# Patient Record
Sex: Male | Born: 1980 | Race: Black or African American | Hispanic: No | Marital: Married | Smoking: Current every day smoker
Health system: Southern US, Community
[De-identification: ages and names within clinical notes are randomized; demographics above are authoritative.]

## PROBLEM LIST (undated history)

## (undated) DIAGNOSIS — I10 Essential (primary) hypertension: Secondary | ICD-10-CM

---

## 2009-09-03 ENCOUNTER — Emergency Department: Payer: Self-pay | Admitting: Emergency Medicine

## 2009-10-31 ENCOUNTER — Encounter: Payer: Self-pay | Admitting: Physician Assistant

## 2009-11-09 ENCOUNTER — Encounter: Payer: Self-pay | Admitting: Physician Assistant

## 2014-08-16 ENCOUNTER — Emergency Department (INDEPENDENT_AMBULATORY_CARE_PROVIDER_SITE_OTHER): Payer: Self-pay

## 2014-08-16 ENCOUNTER — Encounter (HOSPITAL_COMMUNITY): Payer: Self-pay | Admitting: Emergency Medicine

## 2014-08-16 ENCOUNTER — Emergency Department (INDEPENDENT_AMBULATORY_CARE_PROVIDER_SITE_OTHER)
Admission: EM | Admit: 2014-08-16 | Discharge: 2014-08-16 | Disposition: A | Discharge status: Home / Self Care | Payer: Self-pay | Source: Home / Self Care | Attending: Emergency Medicine | Admitting: Emergency Medicine

## 2014-08-16 DIAGNOSIS — J209 Acute bronchitis, unspecified: Secondary | ICD-10-CM

## 2014-08-16 MED ORDER — ALBUTEROL SULFATE HFA 108 (90 BASE) MCG/ACT IN AERS
INHALATION_SPRAY | RESPIRATORY_TRACT | Status: AC
  Filled 2014-08-16: qty 6.7

## 2014-08-16 MED ORDER — ALBUTEROL SULFATE HFA 108 (90 BASE) MCG/ACT IN AERS
1.0000 | INHALATION_SPRAY | RESPIRATORY_TRACT | Status: DC | PRN
  Administered 2014-08-16: 1 via RESPIRATORY_TRACT

## 2014-08-16 MED ORDER — AZITHROMYCIN 250 MG PO TABS
ORAL_TABLET | ORAL | Status: DC
Start: 2014-08-16 — End: 2018-04-01

## 2014-08-16 MED ORDER — PREDNISONE 10 MG PO TABS: ORAL_TABLET | ORAL | Status: DC

## 2014-08-16 NOTE — Discharge Instructions (Signed)

## 2014-08-16 NOTE — ED Provider Notes (Signed)
CSN: [id number]     Arrival date & time 08/16/14  1642 History   First MD Initiated Contact with Patient 08/16/14 1735     Chief Complaint  Patient presents with  . Allergies   (Consider location/radiation/quality/duration/timing/severity/associated sxs/prior Treatment) HPI       34 year old male presents complaining of cough, runny nose, mild shortness of breath when laying down flat, nasal congestion, upper back pain. This started about 6 days ago. He felt very sick for the first day but this is gradually getting better since then. However yesterday his symptoms started getting much worse. He feels more short of breath and feels generalized fatigue. No shortness of breath on exertion. No leg swelling. No history of any heart problems or any other medical illnesses. No recent travel or sick contacts.  History reviewed. No pertinent past medical history. History reviewed. No pertinent past surgical history. No family history on file. History  Substance Use Topics  . Smoking status: Current Every Day Smoker -- 0.50 packs/day    Types: Cigarettes  . Smokeless tobacco: Not on file  . Alcohol Use: No    Review of Systems  Constitutional: Positive for fatigue. Negative for fever and chills.  HENT: Positive for congestion, rhinorrhea and sore throat. Negative for sinus pressure.   Respiratory: Positive for cough and chest tightness. Negative for shortness of breath.   Cardiovascular: Positive for chest pain.  Musculoskeletal: Positive for back pain.  All other systems reviewed and are negative.   Allergies  Review of patient's allergies indicates no known allergies.  Home Medications   Prior to Admission medications   Medication Sig Start Date End Date Taking? Authorizing Provider  azithromycin (ZITHROMAX Z-PAK) 250 MG tablet Use as directed 08/16/14   Graylon GoodZachary H Gentry Pilson, PA-C  predniSONE (DELTASONE) 10 MG tablet 4 tabs PO QD for 4 days; 3 tabs PO QD for 3 days; 2 tabs PO QD for 2 days; 1  tab PO QD for 1 day 08/16/14   Graylon GoodZachary H Sukhraj Esquivias, PA-C   BP 151/98 mmHg  Pulse 77  Temp(Src) 97.4 F (36.3 C) (Oral)  Resp 18  SpO2 96% Physical Exam  Constitutional: He is oriented to person, place, and time. He appears well-developed and well-nourished. No distress.  HENT:  Head: Normocephalic and atraumatic.  Right Ear: Tympanic membrane, external ear and ear canal normal.  Left Ear: Tympanic membrane, external ear and ear canal normal.  Nose: Nose normal. Right sinus exhibits no maxillary sinus tenderness and no frontal sinus tenderness. Left sinus exhibits no maxillary sinus tenderness and no frontal sinus tenderness.  Mouth/Throat: Uvula is midline, oropharynx is clear and moist and mucous membranes are normal. No oropharyngeal exudate or posterior oropharyngeal erythema.  Eyes: Conjunctivae are normal.  Neck: Normal range of motion. Neck supple.  Cardiovascular: Normal rate, regular rhythm and normal heart sounds.   Pulmonary/Chest: Effort normal. No respiratory distress. He has wheezes in the left middle field and the left lower field. He has no rhonchi. He has no rales.  Lymphadenopathy:    He has no cervical adenopathy.  Neurological: He is alert and oriented to person, place, and time. Coordination normal.  Skin: Skin is warm and dry. No rash noted. He is not diaphoretic.  Psychiatric: He has a normal mood and affect. Judgment normal.  Nursing note and vitals reviewed.   ED Course  Procedures (including critical care time) Labs Review Labs Reviewed - No data to display  Imaging Review Dg Chest 2 View  08/16/2014  CLINICAL DATA:  Cough for a week, smoker  EXAM: CHEST  2 VIEW  COMPARISON:  None.  FINDINGS: Cardiomediastinal silhouette is unremarkable. No acute infiltrate or pleural effusion. No pulmonary edema. Bony thorax is unremarkable.  IMPRESSION: No active cardiopulmonary disease.   Electronically Signed   By: Natasha Mead M.D.   On: 08/16/2014 18:13     MDM   1.  Acute bronchitis, unspecified organism    With the double worsening, concern for bacterial infection. Treat with azithromycin as well as symptomatically management. Follow-up when necessary if no improvement in a few days   Meds ordered this encounter  Medications  . predniSONE (DELTASONE) 10 MG tablet    Sig: 4 tabs PO QD for 4 days; 3 tabs PO QD for 3 days; 2 tabs PO QD for 2 days; 1 tab PO QD for 1 day    Dispense:  30 tablet    Refill:  0  . albuterol (PROVENTIL HFA;VENTOLIN HFA) 108 (90 BASE) MCG/ACT inhaler 1-2 puff    Sig:   . azithromycin (ZITHROMAX Z-PAK) 250 MG tablet    Sig: Use as directed    Dispense:  6 each    Refill:  0     Graylon Good, PA-C 08/16/14 1835

## 2014-08-16 NOTE — ED Notes (Signed)
C/o allergy sx onset 5 days Sx include runny nose, SOB, congestion and upper back pain Denies fevers and chills Alert, no signs of acute distress.

## 2018-03-19 ENCOUNTER — Encounter (HOSPITAL_COMMUNITY): Payer: Self-pay | Admitting: Emergency Medicine

## 2018-03-19 ENCOUNTER — Emergency Department (HOSPITAL_COMMUNITY)
Admission: EM | Admit: 2018-03-19 | Discharge: 2018-03-19 | Disposition: A | Discharge status: Home / Self Care | Payer: Self-pay | Attending: Emergency Medicine | Admitting: Emergency Medicine

## 2018-03-19 DIAGNOSIS — F17210 Nicotine dependence, cigarettes, uncomplicated: Secondary | ICD-10-CM | POA: Insufficient documentation

## 2018-03-19 DIAGNOSIS — R1030 Lower abdominal pain, unspecified: Secondary | ICD-10-CM | POA: Insufficient documentation

## 2018-03-19 DIAGNOSIS — R1031 Right lower quadrant pain: Secondary | ICD-10-CM

## 2018-03-19 DIAGNOSIS — I10 Essential (primary) hypertension: Secondary | ICD-10-CM | POA: Insufficient documentation

## 2018-03-19 DIAGNOSIS — K6289 Other specified diseases of anus and rectum: Secondary | ICD-10-CM | POA: Insufficient documentation

## 2018-03-19 HISTORY — DX: Essential (primary) hypertension: I10

## 2018-03-19 MED ORDER — IBUPROFEN 800 MG PO TABS: 800.0000 mg | ORAL_TABLET | Freq: Three times a day (TID) | ORAL | 0 refills | Status: AC | PRN

## 2018-03-19 MED ORDER — METHOCARBAMOL 500 MG PO TABS: 500.0000 mg | ORAL_TABLET | Freq: Three times a day (TID) | ORAL | 0 refills | Status: AC | PRN

## 2018-03-19 NOTE — ED Triage Notes (Signed)
Pt presents with couple days of pain in the posterior central buttock area (crease) and R groin pain; works in [employer] with heavy lifting; pt denies urinary changes, no fever, no n/v/d; pt denies masses or growths to R inguinal area

## 2018-03-19 NOTE — Discharge Instructions (Signed)
We believe that your symptoms are caused by musculoskeletal strain.  Please read through the included information about additional care such as heating pads, over-the-counter pain medicine.  If you were provided a prescription please use it only as needed and as instructed.  Remember that early mobility and using the affected part of your body is actually better than keeping it immobile.  If this groin pain continues your PCP may consider sending you for a CT scan. Call on Monday to schedule a follow up appointment. Do not take the Robaxin if you will be driving or going to work as this can make you sleepy.   Follow-up with the doctor listed as recommended or return to the emergency department with new or worsening symptoms that concern you.

## 2018-03-19 NOTE — ED Notes (Signed)
Patient able to ambulate independently  

## 2018-03-19 NOTE — ED Provider Notes (Signed)
Emergency Department Provider Note   I have reviewed the triage vital signs and the nursing notes.   HISTORY  Chief Complaint Groin Pain and Rectal Pain   HPI Daniel Tanner is a 37 y.o. male with PMH of HTN presents to the emergency department for evaluation of intermittent right groin pain.  The patient states that he will have this pain on and off and it is worse with movement.  He groin masses or testicular/scrotal swelling or pain.  He states he has a job that requires him to do a lot of heavy lifting.  He sometimes has radiation of symptoms into the right upper thigh but not consistently.  No weakness or numbness.  No dysuria, hesitancy, urgency.  No urethral discharge.   Patient is also complaining of some right gluteal pain.  He states it hurts to sit but denies appreciating any masses.  Does not have drainage from this area.  No injury that he can recall.  This is been ongoing for the past week.  Pain is mild and constant.   Past Medical History:  Diagnosis Date  . Hypertension     There are no active problems to display for this patient.   History reviewed. No pertinent surgical history.  Allergies Patient has no known allergies.  History reviewed. No pertinent family history.  Social History Social History   Tobacco Use  . Smoking status: Current Every Day Smoker    Packs/day: 0.50    Types: Cigarettes  Substance Use Topics  . Alcohol use: No  . Drug use: No    Review of Systems  Constitutional: No fever/chills Eyes: No visual changes. ENT: No sore throat. Cardiovascular: Denies chest pain. Respiratory: Denies shortness of breath. Gastrointestinal: No abdominal pain.  No nausea, no vomiting.  No diarrhea.  No constipation. Genitourinary: Negative for dysuria. Musculoskeletal: Negative for back pain. Positive right groin and gluteal swelling.  Skin: Negative for rash. Neurological: Negative for headaches, focal weakness or numbness.  10-point  ROS otherwise negative.  ____________________________________________   PHYSICAL EXAM:  VITAL SIGNS: ED Triage Vitals  Enc Vitals Group     BP 03/19/18 1627 (!) 154/90     Pulse Rate 03/19/18 1627 90     Resp 03/19/18 1627 18     Temp 03/19/18 1627 98.5 F (36.9 C)     Temp Source 03/19/18 1627 Oral     SpO2 03/19/18 1627 100 %     Pain Score 03/19/18 1631 2   Constitutional: Alert and oriented. Well appearing and in no acute distress. Eyes: Conjunctivae are normal.  Head: Atraumatic. Nose: No congestion/rhinnorhea. Mouth/Throat: Mucous membranes are moist. Neck: No stridor.   Cardiovascular: Good peripheral circulation. Respiratory: Normal respiratory effort.  Gastrointestinal:  No distention. No groin tenderness, swelling, or masses. No changes in exam with valsalva. Normal testicular exam without cellulitis or testicle tenderness. No appreciable gluteal masses, abscess, or rash. No focal tenderness. Normal external rectal exam. Area of identified tenderness is near the gluteal cleft midway up the gluteus.  Musculoskeletal: No lower extremity tenderness nor edema. No gross deformities of extremities.  Neurologic:  Normal speech and language. No gross focal neurologic deficits are appreciated.  Skin:  Skin is warm, dry and intact. No rash noted.  ____________________________________________  RADIOLOGY  None ____________________________________________   PROCEDURES  Procedure(s) performed:   Procedures  None  ____________________________________________   INITIAL IMPRESSION / ASSESSMENT AND PLAN / ED COURSE  Pertinent labs & imaging results that were available during  my care of the patient were reviewed by me and considered in my medical decision making (see chart for details).  Groin pain with some associated gluteal discomfort.  I do not appreciate any hernia.  No concern for testicular torsion, infection, or kidney stone.  The patient's exam of the gluteal  region does not demonstrate an obvious abscess.  I have no concern clinically for perirectal abscess as his pain is higher up.  Plan for Motrin as needed and Robaxin for more severe pain in the groin.  This is likely musculoskeletal but I did discuss the possibility of hernia with the patient.  He plans to establish care with a PCP.  I told him that if pain worsened in his groin or he appreciate any sudden masses or swelling he should return to the emergency department.  Otherwise, the patient can follow-up with PCP for outpatient CT if necessary and referral to surgery at that time if the hernia is appreciated.   At this time, I do not feel there is any life-threatening condition present. I have reviewed and discussed all results (EKG, imaging, lab, urine as appropriate), exam findings with patient. I have reviewed nursing notes and appropriate previous records.  I feel the patient is safe to be discharged home without further emergent workup. Discussed usual and customary return precautions. Patient and family (if present) verbalize understanding and are comfortable with this plan.  Patient will follow-up with their primary care provider. If they do not have a primary care provider, information for follow-up has been provided to them. All questions have been answered.  ____________________________________________  FINAL CLINICAL IMPRESSION(S) / ED DIAGNOSES  Final diagnoses:  Right inguinal pain  Rectal pain    NEW OUTPATIENT MEDICATIONS STARTED DURING THIS VISIT:  Discharge Medication List as of 03/19/2018  5:18 PM    START taking these medications   Details  ibuprofen (ADVIL,MOTRIN) 800 MG tablet Take 1 tablet (800 mg total) by mouth every 8 (eight) hours as needed for moderate pain., Starting Fri 03/19/2018, Print    methocarbamol (ROBAXIN) 500 MG tablet Take 1 tablet (500 mg total) by mouth every 8 (eight) hours as needed for muscle spasms., Starting Fri 03/19/2018, Print        Note:   This document was prepared using Dragon voice recognition software and may include unintentional dictation errors.  Alona Bene, MD Emergency Medicine    Kamdyn Covel, Arlyss Repress, MD 03/19/18 1726

## 2018-03-31 NOTE — Progress Notes (Signed)
Patient ID: Daniel Tanner, male   DOB: 07-12-1980, 37 y.o.   MRN: [id number]         Daniel Tanner, is a 37 y.o. male  MVH:[id number]  WUX:[id number]  DOB - 07-12-1980  Subjective:  Chief Complaint and HPI: Daniel Tanner is a 37 y.o. male here today to establish care and for a follow up visit  After being seen in the ED 03/19/2018 for groin and buttocks pain.  Pain is much improved.    I notice his BP is high.  He says "it's always up."  He says at his physical a year or 2 ago it was high.  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  Denies CP/SOB/dizziness/HA   Daniel BushyLouis S Tanner is a 37 y.o. male with PMH of HTN presents to the emergency department for evaluation of intermittent right groin pain.  The patient states that he will have this pain on and off and it is worse with movement.  He groin masses or testicular/scrotal swelling or pain.  He states he has a job that requires him to do a lot of heavy lifting.  He sometimes has radiation of symptoms into the right upper thigh but not consistently.  No weakness or numbness.  No dysuria, hesitancy, urgency.  No urethral discharge.   Patient is also complaining of some right gluteal pain.  He states it hurts to sit but denies appreciating any masses.  Does not have drainage from this area.  No injury that he can recall.  This is been ongoing for the past week.  Pain is mild and constant.  From A/P:  Pertinent labs & imaging results that were available during my care of the patient were reviewed by me and considered in my medical decision making (see chart for details).  Groin pain with some associated gluteal discomfort.  I do not appreciate any hernia.  No concern for testicular torsion, infection, or kidney stone.  The patient's exam of the gluteal region does not demonstrate an obvious abscess.  I have no concern clinically for perirectal abscess as his pain is higher up.  Plan for Motrin as needed and Robaxin for more severe  pain in the groin.  This is likely musculoskeletal but I did discuss the possibility of hernia with the patient.  He plans to establish care with a PCP.  I told him that if pain worsened in his groin or he appreciate any sudden masses or swelling he should return to the emergency department.  Otherwise, the patient can follow-up with PCP for outpatient CT if necessary and referral to surgery at that time if the hernia is appreciated.   At this time, I do not feel there is any life-threatening condition present. I have reviewed and discussed all results (EKG, imaging, lab, urine as appropriate), exam findings with patient. I have reviewed nursing notes and appropriate previous records.  I feel the patient is safe to be discharged home without further emergent workup. Discussed usual and customary return precautions. Patient and family (if present) verbalize understanding and are comfortable with this plan.  Patient will follow-up with their primary care provider. If they do not have a primary care provider, information for follow-up has been provided to them. All questions have been answered.  ED/Hospital notes reviewed.   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   ROS:   Constitutional:  No f/c, No night  sweats, No unexplained weight loss. EENT:  No vision changes, No blurry vision, No hearing changes. No mouth, throat, or ear problems.  Respiratory: No cough, No SOB Cardiac: No CP, no palpitations GI:  No abd pain, No N/V/D. GU: No Urinary s/sx Musculoskeletal: No joint pain Neuro: No headache, no dizziness, no motor weakness.  Skin: No rash Endocrine:  No polydipsia. No polyuria.  Psych: Denies SI/HI  Problem  Hypertension    ALLERGIES: No Known Allergies  PAST MEDICAL HISTORY: Past Medical History:  Diagnosis Date  . Hypertension     MEDICATIONS AT HOME: Prior to Admission medications   Medication Sig Start Date  End Date Taking? Authorizing Provider  ibuprofen (ADVIL,MOTRIN) 800 MG tablet Take 1 tablet (800 mg total) by mouth every 8 (eight) hours as needed for moderate pain. 03/19/18  Yes Long, Arlyss Repress, MD  methocarbamol (ROBAXIN) 500 MG tablet Take 1 tablet (500 mg total) by mouth every 8 (eight) hours as needed for muscle spasms. 03/19/18  Yes Long, Arlyss Repress, MD  amLODipine (NORVASC) 5 MG tablet Take 1 tablet (5 mg total) by mouth daily. 04/01/18   Anders Simmonds, PA-C     Objective:  EXAM:   Vitals:   04/01/18 1607  BP: (!) 142/75  Pulse: 87  Resp: 18  Temp: 99.1 F (37.3 C)  TempSrc: Oral  SpO2: 99%  Weight: 185 lb (83.9 kg)  Height: 6\' 2"  (1.88 m)    General appearance : A&OX3. NAD. Non-toxic-appearing HEENT: Atraumatic and Normocephalic.  PERRLA. EOM intact.  Neck: supple, no JVD. No cervical lymphadenopathy. No thyromegaly Chest/Lungs:  Breathing-non-labored, Good air entry bilaterally, breath sounds normal without rales, rhonchi, or wheezing  CVS: S1 S2 regular, no murmurs, gallops, rubs  Extremities: Bilateral Lower Ext shows no edema, both legs are warm to touch with = pulse throughout Neurology:  CN II-XII grossly intact, Non focal.   Psych:  TP linear. J/I WNL. Normal speech. Appropriate eye contact and affect.  Skin:  No Rash  Data Review No results found for: HGBA1C   Assessment & Plan   1. Hypertension, unspecified type New diagnosis.  DASH diet.  Check BP OOO 3-5 times/week and record and bring to next visit.   - Basic metabolic panel - amLODipine (NORVASC) 5 MG tablet; Take 1 tablet (5 mg total) by mouth daily.  Dispense: 90 tablet; Refill: 3  2. Hospital discharge follow-up Much improved from that  Patient have been counseled extensively about nutrition and exercise  Return in about 1 month (around 05/01/2018) for assign PCP-recheck BP.  The patient was given clear instructions to go to ER or return to medical center if symptoms don't improve, worsen or  new problems develop. The patient verbalized understanding. The patient was told to call to get lab results if they haven't heard anything in the next week.     Georgian Co, PA-C [facility] [place], [place] [phone]   04/01/2018, 4:34 PM

## 2018-04-01 ENCOUNTER — Ambulatory Visit: Payer: Self-pay | Attending: Physician Assistant | Admitting: Physician Assistant

## 2018-04-01 VITALS — BP 142/75 | HR 87 | Temp 99.1°F | Resp 18 | Ht 74.0 in | Wt 185.0 lb

## 2018-04-01 DIAGNOSIS — I10 Essential (primary) hypertension: Secondary | ICD-10-CM | POA: Insufficient documentation

## 2018-04-01 DIAGNOSIS — R102 Pelvic and perineal pain: Secondary | ICD-10-CM

## 2018-04-01 DIAGNOSIS — Z79899 Other long term (current) drug therapy: Secondary | ICD-10-CM | POA: Insufficient documentation

## 2018-04-01 DIAGNOSIS — Z09 Encounter for follow-up examination after completed treatment for conditions other than malignant neoplasm: Secondary | ICD-10-CM

## 2018-04-01 MED ORDER — AMLODIPINE BESYLATE 5 MG PO TABS: 5.0000 mg | ORAL_TABLET | Freq: Every day | ORAL | 3 refills | Status: AC

## 2018-04-01 NOTE — Patient Instructions (Signed)
Check blood pressure 3-5 times weekly and record and bring to next visit.    Hypertension Hypertension is another name for high blood pressure. High blood pressure forces your heart to work harder to pump blood. This can cause problems over time. There are two numbers in a blood pressure reading. There is a top number (systolic) over a bottom number (diastolic). It is best to have a blood pressure below 120/80. Healthy choices can help lower your blood pressure. You may need medicine to help lower your blood pressure if:  Your blood pressure cannot be lowered with healthy choices.  Your blood pressure is higher than 130/80.  Follow these instructions at home: Eating and drinking  If directed, follow the DASH eating plan. This diet includes: ? Filling half of your plate at each meal with fruits and vegetables. ? Filling one quarter of your plate at each meal with whole grains. Whole grains include whole wheat pasta, brown rice, and whole grain bread. ? Eating or drinking low-fat dairy products, such as skim milk or low-fat yogurt. ? Filling one quarter of your plate at each meal with low-fat (lean) proteins. Low-fat proteins include fish, skinless chicken, eggs, beans, and tofu. ? Avoiding fatty meat, cured and processed meat, or chicken with skin. ? Avoiding premade or processed food.  Eat less than 1,500 mg of salt (sodium) a day.  Limit alcohol use to no more than 1 drink a day for nonpregnant women and 2 drinks a day for men. One drink equals 12 oz of beer, 5 oz of wine, or 1 oz of hard liquor. Lifestyle  Work with your doctor to stay at a healthy weight or to lose weight. Ask your doctor what the best weight is for you.  Get at least 30 minutes of exercise that causes your heart to beat faster (aerobic exercise) most days of the week. This may include walking, swimming, or biking.  Get at least 30 minutes of exercise that strengthens your muscles (resistance exercise) at least 3  days a week. This may include lifting weights or pilates.  Do not use any products that contain nicotine or tobacco. This includes cigarettes and e-cigarettes. If you need help quitting, ask your doctor.  Check your blood pressure at home as told by your doctor.  Keep all follow-up visits as told by your doctor. This is important. Medicines  Take over-the-counter and prescription medicines only as told by your doctor. Follow directions carefully.  Do not skip doses of blood pressure medicine. The medicine does not work as well if you skip doses. Skipping doses also puts you at risk for problems.  Ask your doctor about side effects or reactions to medicines that you should watch for. Contact a doctor if:  You think you are having a reaction to the medicine you are taking.  You have headaches that keep coming back (recurring).  You feel dizzy.  You have swelling in your ankles.  You have trouble with your vision. Get help right away if:  You get a very bad headache.  You start to feel confused.  You feel weak or numb.  You feel faint.  You get very bad pain in your: ? Chest. ? Belly (abdomen).  You throw up (vomit) more than once.  You have trouble breathing. Summary  Hypertension is another name for high blood pressure.  Making healthy choices can help lower blood pressure. If your blood pressure cannot be controlled with healthy choices, you may need to take   This information is not intended to replace advice given to you by your health care provider. Make sure you discuss any questions you have with your health care provider. Document Released: 10/15/2007 Document Revised: 03/26/2016 Document Reviewed: 03/26/2016 Elsevier Interactive Patient Education  [employer].

## 2018-04-02 ENCOUNTER — Telehealth: Payer: Self-pay | Admitting: *Deleted

## 2018-04-02 LAB — BASIC METABOLIC PANEL
BUN/Creatinine Ratio: 15 (ref 9–20)
BUN: 13 mg/dL (ref 6–20)
CALCIUM: 9.1 mg/dL (ref 8.7–10.2)
CO2: 25 mmol/L (ref 20–29)
Chloride: 100 mmol/L (ref 96–106)
Creatinine, Ser: 0.86 mg/dL (ref 0.76–1.27)
GFR calc Af Amer: 128 mL/min/{1.73_m2} (ref >59)
GFR, EST NON AFRICAN AMERICAN: 111 mL/min/{1.73_m2} (ref >59)
GLUCOSE: 84 mg/dL (ref 65–99)
POTASSIUM: 4 mmol/L (ref 3.5–5.2)
Sodium: 138 mmol/L (ref 134–144)

## 2018-04-02 NOTE — Telephone Encounter (Signed)
-----   Message from Anders SimmondsAngela M McClung, [place]PA-C sent at 04/02/2018  8:24 AM EST ----- Please call patient.  No diabetes.  Kidney function and electrolytes are good.  Take BP meds and check BP as discussed and follow-up as planned.  Thanks, Georgian CoAngela McClung, PA-C

## 2018-04-02 NOTE — Telephone Encounter (Signed)
MA unable to reach patient due ringing with no answer and VM option is not available. !!!Please inform patient of not being diabetes and all his labs look good. Take his BP medication and follow up as planned!!!

## 2021-01-12 ENCOUNTER — Other Ambulatory Visit: Payer: Self-pay

## 2021-01-12 ENCOUNTER — Ambulatory Visit (INDEPENDENT_AMBULATORY_CARE_PROVIDER_SITE_OTHER): Payer: 59

## 2021-01-12 ENCOUNTER — Ambulatory Visit
Admission: EM | Admit: 2021-01-12 | Discharge: 2021-01-12 | Disposition: A | Discharge status: Home / Self Care | Payer: 59 | Attending: Urgent Care | Admitting: Urgent Care

## 2021-01-12 DIAGNOSIS — M545 Low back pain, unspecified: Secondary | ICD-10-CM | POA: Diagnosis not present

## 2021-01-12 DIAGNOSIS — S39012A Strain of muscle, fascia and tendon of lower back, initial encounter: Secondary | ICD-10-CM | POA: Diagnosis not present

## 2021-01-12 DIAGNOSIS — M5136 Other intervertebral disc degeneration, lumbar region: Secondary | ICD-10-CM

## 2021-01-12 LAB — POCT URINALYSIS DIP (MANUAL ENTRY)
Bilirubin, UA: NEGATIVE
Blood, UA: NEGATIVE
Glucose, UA: NEGATIVE mg/dL
Ketones, POC UA: NEGATIVE mg/dL
Leukocytes, UA: NEGATIVE
Nitrite, UA: NEGATIVE
Protein Ur, POC: NEGATIVE mg/dL
Spec Grav, UA: 1.02 (ref 1.010–1.025)
Urobilinogen, UA: 0.2 E.U./dL
pH, UA: 7 (ref 5.0–8.0)

## 2021-01-12 MED ORDER — NAPROXEN 500 MG PO TABS: 500.0000 mg | ORAL_TABLET | Freq: Two times a day (BID) | ORAL | 0 refills | Status: AC

## 2021-01-12 MED ORDER — TIZANIDINE HCL 4 MG PO TABS: 4.0000 mg | ORAL_TABLET | Freq: Every day | ORAL | 0 refills | Status: AC

## 2021-01-12 NOTE — ED Provider Notes (Signed)
[facility]   MRN: [id number] DOB: [birth year]  Subjective:   Daniel Tanner is a 40 y.o. male presenting for 5-day history of acute onset low back pain over the midline extending to the right side with intermittent radiation into the right buttock.  Patient states that rising from a sitting to a standing position can aggravate it, also has a difficult time when he lays down.  He does intermittent heavy lifting at work and thinks this may be a source of his symptoms.  No particular fall, trauma, weakness, numbness or tingling.  No history of back issues.  No history of kidney stones.  No current facility-administered medications for this encounter.  Current Outpatient Medications:    amLODipine (NORVASC) 5 MG tablet, Take 1 tablet (5 mg total) by mouth daily., Disp: 90 tablet, Rfl: 3   ibuprofen (ADVIL,MOTRIN) 800 MG tablet, Take 1 tablet (800 mg total) by mouth every 8 (eight) hours as needed for moderate pain., Disp: 21 tablet, Rfl: 0   methocarbamol (ROBAXIN) 500 MG tablet, Take 1 tablet (500 mg total) by mouth every 8 (eight) hours as needed for muscle spasms., Disp: 20 tablet, Rfl: 0   No Known Allergies  Past Medical History:  Diagnosis Date   Hypertension      History reviewed. No pertinent surgical history.  History reviewed. No pertinent family history.  Social History   Tobacco Use   Smoking status: Every Day    Packs/day: 0.50    Types: Cigarettes   Smokeless tobacco: Never  Substance Use Topics   Alcohol use: No   Drug use: No    ROS   Objective:   Vitals: BP 132/78 (BP Location: Left Arm)   Pulse 89   Temp 97.6 F (36.4 C) (Oral)   Resp 18   SpO2 100%   Physical Exam Constitutional:      General: He is not in acute distress.    Appearance: Normal appearance. He is well-developed and normal weight. He is not ill-appearing, toxic-appearing or diaphoretic.  HENT:     Head: Normocephalic and atraumatic.     Right Ear: External ear  normal.     Left Ear: External ear normal.     Nose: Nose normal.     Mouth/Throat:     Pharynx: Oropharynx is clear.  Eyes:     General: No scleral icterus.       Right eye: No discharge.        Left eye: No discharge.     Extraocular Movements: Extraocular movements intact.     Pupils: Pupils are equal, round, and reactive to light.  Cardiovascular:     Rate and Rhythm: Normal rate.  Pulmonary:     Effort: Pulmonary effort is normal.  Musculoskeletal:     Cervical back: Normal range of motion.     Lumbar back: Tenderness (along the areas outlined including the midline) and bony tenderness present. No swelling, edema, deformity, signs of trauma, lacerations or spasms. Decreased range of motion. Negative right straight leg raise test and negative left straight leg raise test. No scoliosis.       Back:  Skin:    General: Skin is warm and dry.  Neurological:     Mental Status: He is alert and oriented to person, place, and time.     Motor: No weakness.     Coordination: Coordination normal.     Gait: Gait normal.     Deep Tendon Reflexes: Reflexes normal.  Psychiatric:  Mood and Affect: Mood normal.        Behavior: Behavior normal.        Thought Content: Thought content normal.        Judgment: Judgment normal.    Results for orders placed or performed during the hospital encounter of 01/12/21 (from the past 24 hour(s))  POCT urinalysis dipstick     Status: None   Collection Time: 01/12/21  2:42 PM  Result Value Ref Range   Color, UA yellow yellow   Clarity, UA clear clear   Glucose, UA negative negative mg/dL   Bilirubin, UA negative negative   Ketones, POC UA negative negative mg/dL   Spec Grav, UA 6.546 5.035 - 1.025   Blood, UA negative negative   pH, UA 7.0 5.0 - 8.0   Protein Ur, POC negative negative mg/dL   Urobilinogen, UA 0.2 0.2 or 1.0 E.U./dL   Nitrite, UA Negative Negative   Leukocytes, UA Negative Negative   DG Lumbar Spine Complete  Result  Date: 01/12/2021 CLINICAL DATA:  Right-sided lumbar pain for 5 days.  Low back pain. EXAM: LUMBAR SPINE - COMPLETE 4+ VIEW COMPARISON:  None. FINDINGS: There are 5 lumbar type vertebra. Straightening of normal lordosis. Vertebral body heights are normal. There is no listhesis. The posterior elements are intact. Disc space narrowing and endplate spurring at L2-L3. Minor endplate spurring at L3-L4. No fracture, pars defect or focal bone abnormality. Sacroiliac joints are symmetric and normal. IMPRESSION: 1. Mild degenerative disc disease at L2-L3 and to a lesser extent at L3-L4. 2. Straightening of normal lordosis may be positioning or muscle spasm. Electronically Signed   By: Narda Rutherford M.D.   On: 01/12/2021 15:30     Assessment and Plan :   PDMP not reviewed this encounter.  1. Low back strain, initial encounter   2. Acute midline low back pain without sciatica   3. Degenerative disc disease, lumbar     Will manage conservatively for back strain with NSAID and muscle relaxant, rest and modification of physical activity.  Anticipatory guidance provided.  Counseled patient on potential for adverse effects with medications prescribed/recommended today, ER and return-to-clinic precautions discussed, patient verbalized understanding.    Wallis Bamberg, PA-C 01/12/21 1544

## 2021-01-12 NOTE — ED Triage Notes (Signed)
Five day h/o right sided lumbar pain that has worsened since the onset. Pain "shoots" intermittently into his right groin. Squatting, bending and transitioning from a seated to standing position aggravate sxs. Has been taking ibuprofen without relief. Pt does heavy lifting at work. Denies BLE pain and n/t. No falls noted.

## 2021-04-18 ENCOUNTER — Ambulatory Visit: Payer: Self-pay | Admitting: Nurse Practitioner

## 2022-02-03 IMAGING — DX DG LUMBAR SPINE COMPLETE 4+V
5 series · 5 of 5 positions shown · non-contrast
Comparison: None.

CLINICAL DATA: Right-sided lumbar pain for 5 days.  Low back pain.

EXAM:
LUMBAR SPINE - COMPLETE 4+ VIEW

[lumbar spine ap]
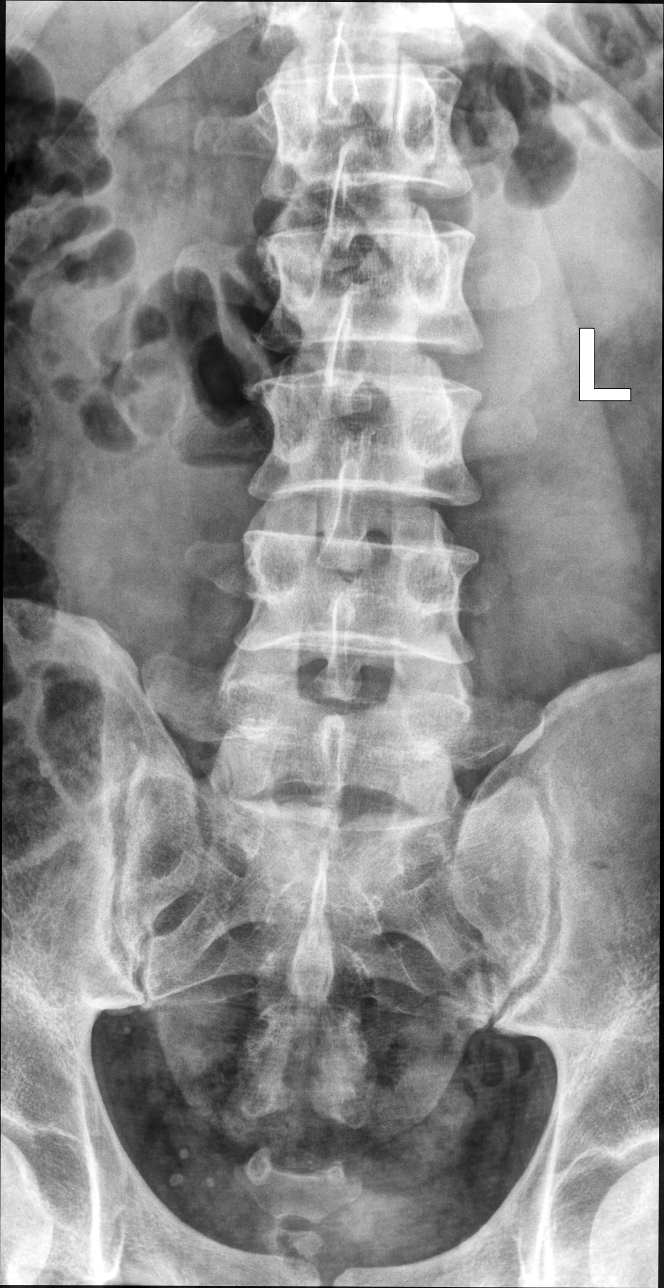

[lumbar spine oblique supine (1 of 2)]
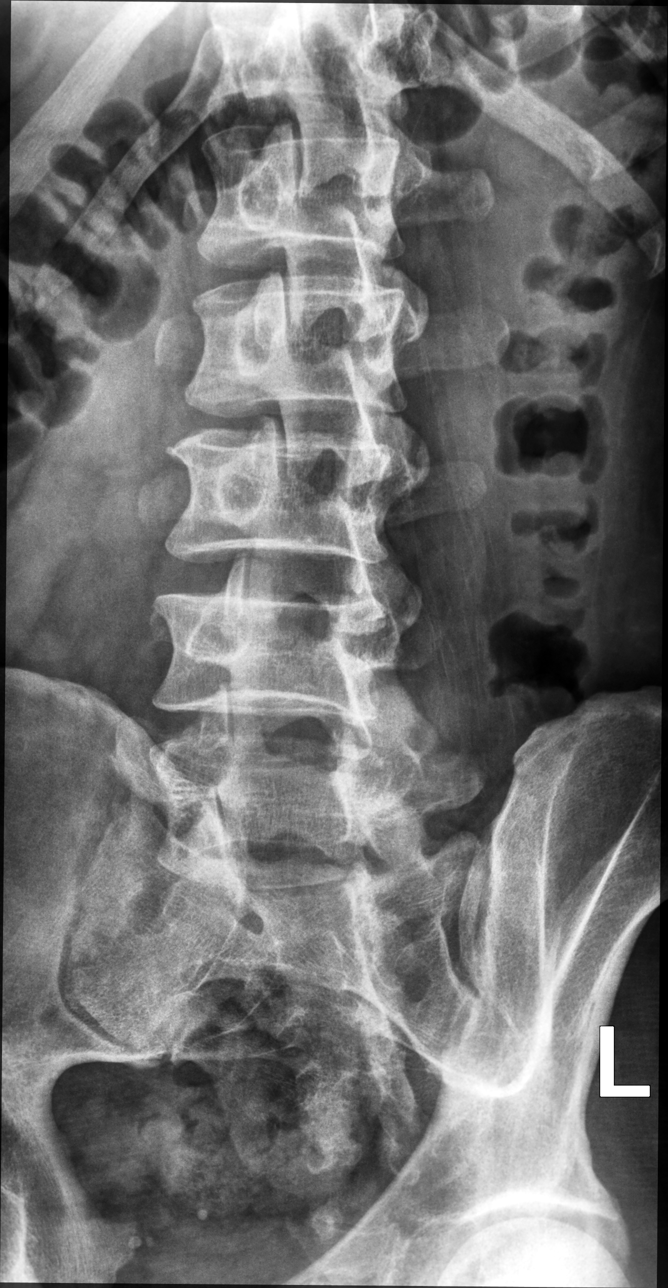

[lumbar spine oblique supine (2 of 2)]
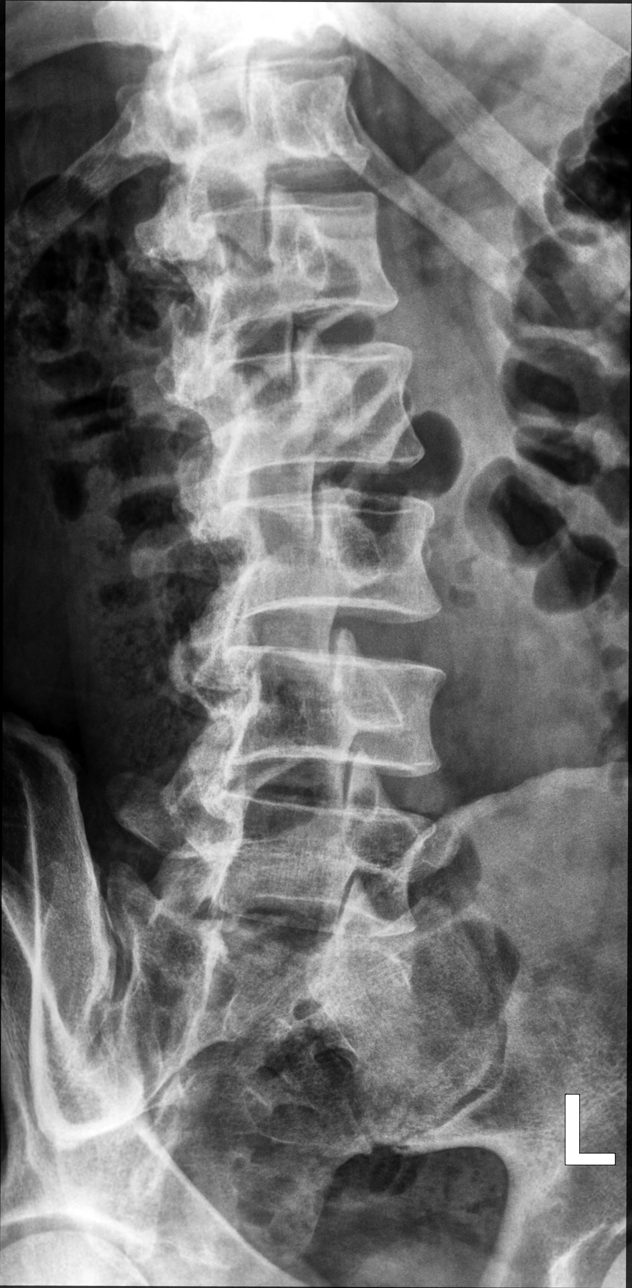

[lumbar spine lat]
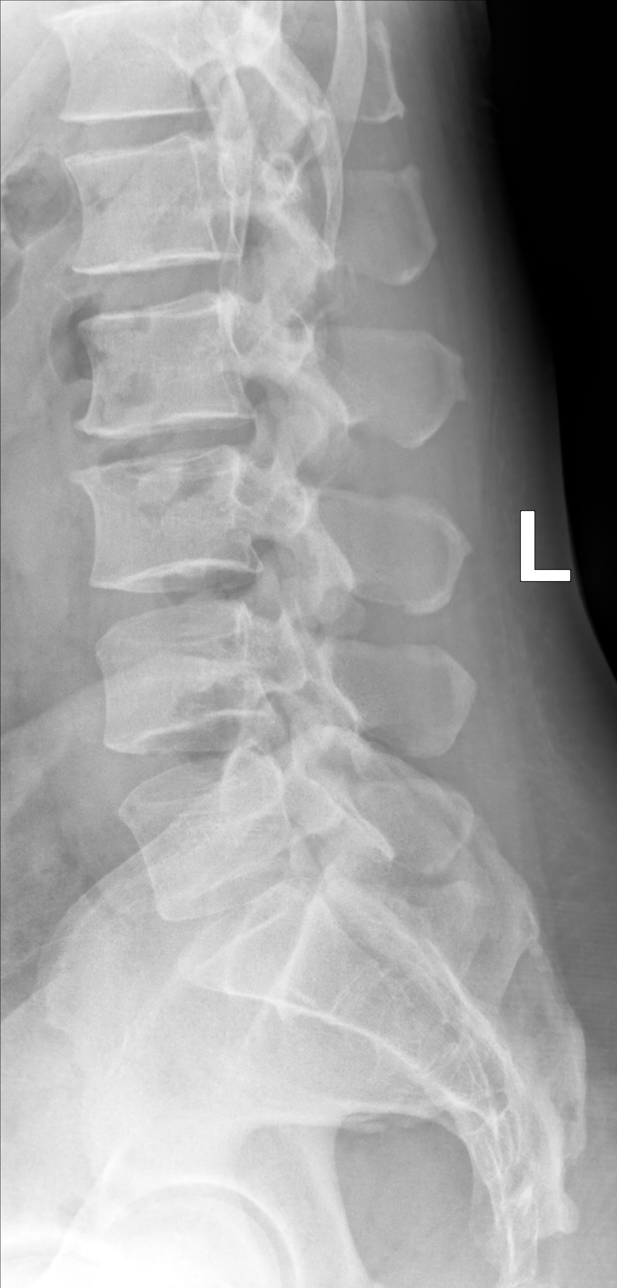

[lumbar spine l5-s1 spot]
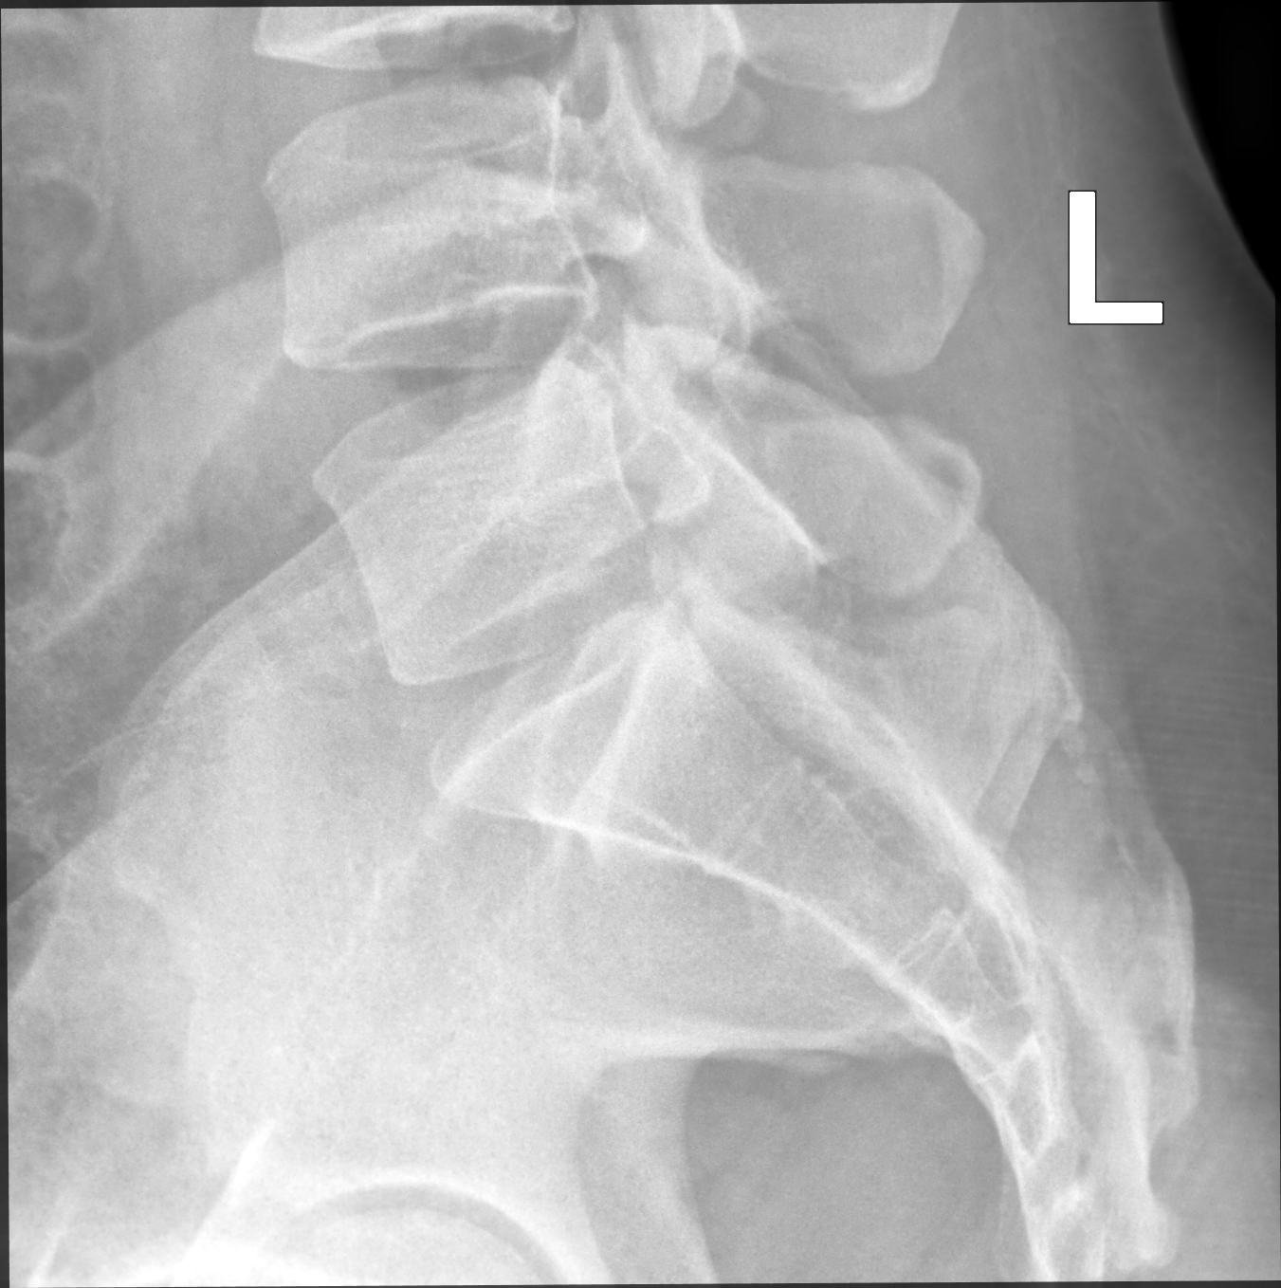

[5 of 5 positions shown; findings below may reference images not displayed]

FINDINGS: There are 5 lumbar type vertebra. Straightening of normal lordosis.
Vertebral body heights are normal. There is no listhesis. The
posterior elements are intact. Disc space narrowing and endplate
spurring at L2-L3. Minor endplate spurring at L3-L4. No fracture,
pars defect or focal bone abnormality. Sacroiliac joints are
symmetric and normal.
IMPRESSION: 1. Mild degenerative disc disease at L2-L3 and to a lesser extent at
L3-L4.
2. Straightening of normal lordosis may be positioning or muscle
spasm.

## 2023-10-20 ENCOUNTER — Ambulatory Visit
Admission: EM | Admit: 2023-10-20 | Discharge: 2023-10-20 | Disposition: A | Discharge status: Home / Self Care | Attending: Physician Assistant | Admitting: Physician Assistant

## 2023-10-20 ENCOUNTER — Other Ambulatory Visit: Payer: Self-pay

## 2023-10-20 ENCOUNTER — Encounter: Payer: Self-pay | Admitting: Emergency Medicine

## 2023-10-20 DIAGNOSIS — J0120 Acute ethmoidal sinusitis, unspecified: Secondary | ICD-10-CM | POA: Diagnosis not present

## 2023-10-20 MED ORDER — AMOXICILLIN-POT CLAVULANATE 875-125 MG PO TABS: 1.0000 | ORAL_TABLET | Freq: Two times a day (BID) | ORAL | 0 refills | Status: AC

## 2023-10-20 NOTE — ED Triage Notes (Signed)
 Pt here or bilateral eye swelling upon waking today

## 2023-10-20 NOTE — ED Provider Notes (Signed)
 [facility]    CSN: [id number] Arrival date & time: 10/20/23  1625      History   Chief Complaint Chief Complaint  Patient presents with   Facial Swelling    HPI Daniel Tanner is a 43 y.o. male.   Patient complains of swelling to the left side of his face.  Patient reports he has swelling below his eye.  Patient is unsure if the swelling is coming from a tooth or from his sinuses.  Patient states that he does not have a dentist.  Patient denies any fever or chills he is not having any visual concerns.  Patient denies any cough or congestion.  He has a past medical history of hypertension.     Past Medical History:  Diagnosis Date   Hypertension     Patient Active Problem List   Diagnosis Date Noted   Hypertension 04/01/2018    History reviewed. No pertinent surgical history.     Home Medications    Prior to Admission medications   Medication Sig Start Date End Date Taking? Authorizing Provider  amoxicillin-clavulanate (AUGMENTIN) 875-125 MG tablet Take 1 tablet by mouth 2 (two) times daily. 10/20/23  Yes Chriss Redel K, PA-C  amLODipine  (NORVASC ) 5 MG tablet Take 1 tablet (5 mg total) by mouth daily. 04/01/18   Hassie Lint, PA-C  ibuprofen  (ADVIL ,MOTRIN ) 800 MG tablet Take 1 tablet (800 mg total) by mouth every 8 (eight) hours as needed for moderate pain. 03/19/18   Long, Shereen Dike, MD  methocarbamol  (ROBAXIN ) 500 MG tablet Take 1 tablet (500 mg total) by mouth every 8 (eight) hours as needed for muscle spasms. 03/19/18   Long, Shereen Dike, MD  naproxen  (NAPROSYN ) 500 MG tablet Take 1 tablet (500 mg total) by mouth 2 (two) times daily with a meal. 01/12/21   Adolph Hoop, PA-C  tiZANidine  (ZANAFLEX ) 4 MG tablet Take 1 tablet (4 mg total) by mouth at bedtime. 01/12/21   Adolph Hoop, PA-C    Family History History reviewed. No pertinent family history.  Social History Social History   Tobacco Use   Smoking status: Every Day    Current packs/day: 0.50     Types: Cigarettes   Smokeless tobacco: Never  Substance Use Topics   Alcohol use: No   Drug use: No     Allergies   Patient has no known allergies.   Review of Systems Review of Systems  All other systems reviewed and are negative.    Physical Exam Triage Vital Signs ED Triage Vitals  Encounter Vitals Group     BP 10/20/23 1641 (!) 176/100     Systolic BP Percentile --      Diastolic BP Percentile --      Pulse Rate 10/20/23 1641 82     Resp 10/20/23 1641 18     Temp 10/20/23 1641 98.8 F (37.1 C)     Temp Source 10/20/23 1641 Oral     SpO2 10/20/23 1641 96 %     Weight --      Height --      Head Circumference --      Peak Flow --      Pain Score 10/20/23 1642 2     Pain Loc --      Pain Education --      Exclude from Growth Chart --    No data found.  Updated Vital Signs BP (!) 176/100 (BP Location: Left Arm)   Pulse 82  Temp 98.8 F (37.1 C) (Oral)   Resp 18   SpO2 96%   Visual Acuity Right Eye Distance:   Left Eye Distance:   Bilateral Distance:    Right Eye Near:   Left Eye Near:    Bilateral Near:     Physical Exam Vitals and nursing note reviewed.  Constitutional:      Appearance: He is well-developed.  HENT:     Head: Normocephalic.     Comments: Swelling  left cheek and below left eye.  Tender maxillary sinus    Nose:     Comments: Swollen left nasal turbinates Cardiovascular:     Rate and Rhythm: Normal rate.  Pulmonary:     Effort: Pulmonary effort is normal.  Abdominal:     General: There is no distension.  Musculoskeletal:        General: Normal range of motion.     Cervical back: Normal range of motion.  Skin:    General: Skin is warm.  Neurological:     General: No focal deficit present.     Mental Status: He is alert and oriented to person, place, and time.      UC Treatments / Results  Labs (all labs ordered are listed, but only abnormal results are displayed) Labs Reviewed - No data to  display  EKG   Radiology No results found.  Procedures Procedures (including critical care time)  Medications Ordered in UC Medications - No data to display  Initial Impression / Assessment and Plan / UC Course  I have reviewed the triage vital signs and the nursing notes.  Pertinent labs & imaging results that were available during my care of the patient were reviewed by me and considered in my medical decision making (see chart for details).     Patient is given a prescription for Augmentin.  I am unsure if swelling is dental in origin or if this is from his sinuses.  Patient is advised to schedule dental follow-up he is to return if symptoms worsen or change. Final Clinical Impressions(s) / UC Diagnoses   Final diagnoses:  Acute non-recurrent ethmoidal sinusitis   Discharge Instructions      Return if any problems.   ED Prescriptions     Medication Sig Dispense Auth. Provider   amoxicillin-clavulanate (AUGMENTIN) 875-125 MG tablet Take 1 tablet by mouth 2 (two) times daily. 20 tablet Aki Abalos K, PA-C      PDMP not reviewed this encounter. An After Visit Summary was printed and given to the patient.       Sandi Crosby, PA-C 10/20/23 1920

## 2023-10-20 NOTE — Discharge Instructions (Addendum)
 Return if any problems.
